# Patient Record
Sex: Female | Born: 1985 | Race: White | Hispanic: No | Marital: Single | Smoking: Former smoker
Health system: Southern US, Community
[De-identification: ages and names within clinical notes are randomized; demographics above are authoritative.]

## PROBLEM LIST (undated history)

## (undated) DIAGNOSIS — I10 Essential (primary) hypertension: Secondary | ICD-10-CM

---
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
## 2015-09-19 DIAGNOSIS — F419 Anxiety disorder, unspecified: Secondary | ICD-10-CM | POA: Insufficient documentation

## 2016-01-18 DIAGNOSIS — N200 Calculus of kidney: Secondary | ICD-10-CM | POA: Insufficient documentation

## 2016-07-11 ENCOUNTER — Ambulatory Visit: Payer: Self-pay | Admitting: Sports Medicine

## 2018-02-18 ENCOUNTER — Ambulatory Visit (INDEPENDENT_AMBULATORY_CARE_PROVIDER_SITE_OTHER): Payer: Self-pay | Admitting: Podiatry

## 2018-02-18 ENCOUNTER — Encounter: Payer: Self-pay | Admitting: Podiatry

## 2018-02-18 VITALS — BP 114/79 | HR 61 | Resp 16 | Ht 63.0 in | Wt 135.0 lb

## 2018-02-18 DIAGNOSIS — L600 Ingrowing nail: Secondary | ICD-10-CM

## 2018-02-18 DIAGNOSIS — F419 Anxiety disorder, unspecified: Secondary | ICD-10-CM

## 2018-02-18 DIAGNOSIS — F411 Generalized anxiety disorder: Secondary | ICD-10-CM

## 2018-02-18 DIAGNOSIS — M79676 Pain in unspecified toe(s): Secondary | ICD-10-CM

## 2018-02-18 MED ORDER — HYDROCODONE-ACETAMINOPHEN 5-325 MG PO TABS: 1.0000 | ORAL_TABLET | Freq: Four times a day (QID) | ORAL | 0 refills | Status: DC | PRN

## 2018-02-18 MED ORDER — NEOMYCIN-POLYMYXIN-HC 3.5-10000-1 OT SOLN: OTIC | 0 refills | Status: DC

## 2018-02-18 NOTE — Progress Notes (Signed)
  Subjective:  Patient ID: Teresa Lam, female    DOB: 17-Jan-1986,  MRN: [id number]  Chief Complaint  Patient presents with  . Nail Problem    R hallux medial border x 2 weeks; 8/10 throbbing pain -no injury/n/V/F/CH -w/ bloody drainage, swelling. and redness Tx: peroxide, alcohol and epsom salt    32 y.o. female presents with the above complaint.  Review of Systems: Negative except as noted in the HPI. Denies N/V/F/Ch.  History reviewed. No pertinent past medical history.  Current Outpatient Medications:  .  labetalol (NORMODYNE) 100 MG tablet, Take 100 mg by mouth 2 (two) times daily., Disp: , Rfl:  .  norethindrone (MICRONOR,CAMILA,ERRIN) 0.35 MG tablet, Take 1 tablet by mouth daily., Disp: , Rfl:   Social History   Tobacco Use  Smoking Status Never Smoker  Smokeless Tobacco Never Used    Not on File Objective:   Vitals:   02/18/18 0917  BP: 114/79  Pulse: 61  Resp: 16   Body mass index is 23.91 kg/m. Constitutional Well developed. Well nourished.  Vascular Dorsalis pedis pulses palpable bilaterally. Posterior tibial pulses palpable bilaterally. Capillary refill normal to all digits.  No cyanosis or clubbing noted. Pedal hair growth normal.  Neurologic Normal speech. Oriented to person, place, and time. Epicritic sensation to light touch grossly present bilaterally.  Dermatologic Painful ingrowing nail at medial nail borders of the hallux nail right. No other open wounds. No skin lesions.  Orthopedic: Normal joint ROM without pain or crepitus bilaterally. No visible deformities. No bony tenderness.   Radiographs: None Assessment:   1. Anxiety due to invasive procedure   2. Ingrown nail   3. Pain around toenail    Plan:  Patient was evaluated and treated and all questions answered.  Ingrown Nail, right -Patient elects to proceed with minor surgery to remove ingrown toenail removal today. Consent reviewed and signed by patient. -Ingrown nail  excised. See procedure note. -Educated on post-procedure care including soaking. Written instructions provided and reviewed. -Patient to follow up in 2 weeks for nail check. -Small Rx for Norco per patient reqeust  Procedure: Excision of Ingrown Toenail Location: Right 1st toe medial nail borders. Anesthesia: Lidocaine 1% plain; 1.5 mL and Marcaine 0.5% plain; 1.5 mL, digital block. Skin Prep: Betadine. Dressing: Silvadene; telfa; dry, sterile, compression dressing. Technique: Following skin prep, the toe was exsanguinated and a tourniquet was secured at the base of the toe. The affected nail border was freed, split with a nail splitter, and excised. Chemical matrixectomy was then performed with phenol and irrigated out with alcohol. The tourniquet was then removed and sterile dressing applied. Disposition: Patient tolerated procedure well. Patient to return in 2 weeks for follow-up.   No follow-ups on file.

## 2018-02-18 NOTE — Patient Instructions (Signed)

## 2018-02-18 NOTE — Progress Notes (Signed)
   Subjective:    Patient ID: Teresa Lam, female    DOB: [birth year], 32 y.o.   MRN: [id number]  HPI    Review of Systems  All other systems reviewed and are negative.      Objective:   Physical Exam        Assessment & Plan:

## 2018-03-04 ENCOUNTER — Ambulatory Visit: Payer: Self-pay | Admitting: Podiatry

## 2018-12-26 ENCOUNTER — Other Ambulatory Visit: Payer: Self-pay

## 2018-12-26 ENCOUNTER — Inpatient Hospital Stay (HOSPITAL_COMMUNITY): Payer: Medicaid Other

## 2018-12-26 ENCOUNTER — Inpatient Hospital Stay (HOSPITAL_COMMUNITY)
Admission: EM | Admit: 2018-12-26 | Discharge: 2018-12-27 | Disposition: A | Discharge status: Home / Self Care | Payer: Medicaid Other | Attending: Obstetrics and Gynecology | Admitting: Obstetrics and Gynecology

## 2018-12-26 ENCOUNTER — Encounter (HOSPITAL_COMMUNITY): Payer: Self-pay | Admitting: *Deleted

## 2018-12-26 DIAGNOSIS — O209 Hemorrhage in early pregnancy, unspecified: Secondary | ICD-10-CM | POA: Insufficient documentation

## 2018-12-26 DIAGNOSIS — O3680X0 Pregnancy with inconclusive fetal viability, not applicable or unspecified: Secondary | ICD-10-CM | POA: Diagnosis not present

## 2018-12-26 DIAGNOSIS — Z87891 Personal history of nicotine dependence: Secondary | ICD-10-CM | POA: Insufficient documentation

## 2018-12-26 DIAGNOSIS — Z3A01 Less than 8 weeks gestation of pregnancy: Secondary | ICD-10-CM | POA: Diagnosis not present

## 2018-12-26 HISTORY — DX: Essential (primary) hypertension: I10

## 2018-12-26 LAB — WET PREP, GENITAL
Clue Cells Wet Prep HPF POC: NONE SEEN
Sperm: NONE SEEN
Trich, Wet Prep: NONE SEEN
Yeast Wet Prep HPF POC: NONE SEEN

## 2018-12-26 LAB — CBC
HCT: 34.1 % — ABNORMAL LOW (ref 36.0–46.0)
Hemoglobin: 11.7 g/dL — ABNORMAL LOW (ref 12.0–15.0)
MCH: 31.7 pg (ref 26.0–34.0)
MCHC: 34.3 g/dL (ref 30.0–36.0)
MCV: 92.4 fL (ref 80.0–100.0)
Platelets: 212 10*3/uL (ref 150–400)
RBC: 3.69 MIL/uL — ABNORMAL LOW (ref 3.87–5.11)
RDW: 12.2 % (ref 11.5–15.5)
WBC: 7.8 10*3/uL (ref 4.0–10.5)
nRBC: 0 % (ref 0.0–0.2)

## 2018-12-26 LAB — ABO/RH: ABO/RH(D): O POS

## 2018-12-26 LAB — POCT PREGNANCY, URINE: Preg Test, Ur: POSITIVE — AB

## 2018-12-26 LAB — HCG, QUANTITATIVE, PREGNANCY: hCG, Beta Chain, Quant, S: 536 m[IU]/mL — ABNORMAL HIGH (ref <5)

## 2018-12-26 NOTE — Discharge Instructions (Signed)
Return to care  °· If you have heavier bleeding that soaks through more that 2 pads per hour for an hour or more °· If you bleed so much that you feel like you might pass out or you do pass out °· If you have significant abdominal pain that is not improved with Tylenol  ° ° ° ° °Vaginal Bleeding During Pregnancy, First Trimester ° °A small amount of bleeding from the vagina (spotting) is relatively common during early pregnancy. It usually stops on its own. Various things may cause bleeding or spotting during early pregnancy. Some bleeding may be related to the pregnancy, and some may not. In many cases, the bleeding is normal and is not a problem. However, bleeding can also be a sign of something serious. Be sure to tell your health care provider about any vaginal bleeding right away. °Some possible causes of vaginal bleeding during the first trimester include: °· Infection or inflammation of the cervix. °· Growths (polyps) on the cervix. °· Miscarriage or threatened miscarriage. °· Pregnancy tissue developing outside of the uterus (ectopic pregnancy). °· A mass of tissue developing in the uterus due to an egg being fertilized incorrectly (molar pregnancy). °Follow these instructions at home: °Activity °· Follow instructions from your health care provider about limiting your activity. Ask what activities are safe for you. °· If needed, make plans for someone to help with your regular activities. °· Do not have sex or orgasms until your health care provider says that this is safe. °General instructions °· Take over-the-counter and prescription medicines only as told by your health care provider. °· Pay attention to any changes in your symptoms. °· Do not use tampons or douche. °· Write down how many pads you use each day, how often you change pads, and how soaked (saturated) they are. °· If you pass any tissue from your vagina, save the tissue so you can show it to your health care provider. °· Keep all follow-up  visits as told by your health care provider. This is important. °Contact a health care provider if: °· You have vaginal bleeding during any part of your pregnancy. °· You have cramps or labor pains. °· You have a fever. °Get help right away if: °· You have severe cramps in your back or abdomen. °· You pass large clots or a large amount of tissue from your vagina. °· Your bleeding increases. °· You feel light-headed or weak, or you faint. °· You have chills. °· You are leaking fluid or have a gush of fluid from your vagina. °Summary °· A small amount of bleeding (spotting) from the vagina is relatively common during early pregnancy. °· Various things may cause bleeding or spotting in early pregnancy. °· Be sure to tell your health care provider about any vaginal bleeding right away. °This information is not intended to replace advice given to you by your health care provider. Make sure you discuss any questions you have with your health care provider. °Document Released: 12/13/2004 Document Revised: 06/24/2018 Document Reviewed: 06/07/2016 °Elsevier Patient Education © 2020 [employer] ° °

## 2018-12-26 NOTE — MAU Provider Note (Signed)
Chief Complaint: Vaginal Bleeding   First Provider Initiated Contact with Patient 12/26/18 2208     SUBJECTIVE HPI: Teresa Lam is a 33 y.o. [id number] at 3835w5d by LMP who presents to [facility] reporting vaginal bleeding & abdominal cramping. Was on OCPs but missed a pill this month. Has had bleeding during this week that she initially thought was a period. Not saturating pads or passing clots but changes a half full pad every 2 hours. Also has abdominal cramping & "Heaviness".   Location: abdomen Quality: cramping, heavy Severity: 6/10 on pain scale Duration: 1 week Timing: intermittent Modifying factors: none Associated signs and symptoms: vaginal bleeding  Past Medical History:  Diagnosis Date  . Hypertension    OB History  Gravida Para Term Preterm AB Living  4 2 2   1 2   SAB TAB Ectopic Multiple Live Births  1       2    # Outcome Date GA Lbr Len/2nd Weight Sex Delivery Anes PTL Lv  4 Current           3 Term 2017     Vag-Spont     2 SAB 2015 6838w0d         1 Term 2013     Vag-Spont      History reviewed. No pertinent surgical history. Social History   Socioeconomic History  . Marital status: Single    Spouse name: Not on file  . Number of children: Not on file  . Years of education: Not on file  . Highest education level: Not on file  Occupational History  . Not on file  Social Needs  . Financial resource strain: Not on file  . Food insecurity    Worry: Not on file    Inability: Not on file  . Transportation needs    Medical: Not on file    Non-medical: Not on file  Tobacco Use  . Smoking status: Former Games developermoker  . Smokeless tobacco: Never Used  Substance and Sexual Activity  . Alcohol use: Yes    Comment: social  . Drug use: Yes    Types: Marijuana  . Sexual activity: Yes  Lifestyle  . Physical activity    Days per week: Not on file    Minutes per session: Not on file  . Stress: Not on file  Relationships  . Social Musicianconnections    Talks  on phone: Not on file    Gets together: Not on file    Attends religious service: Not on file    Active member of club or organization: Not on file    Attends meetings of clubs or organizations: Not on file    Relationship status: Not on file  . Intimate partner violence    Fear of current or ex partner: Not on file    Emotionally abused: Not on file    Physically abused: Not on file    Forced sexual activity: Not on file  Other Topics Concern  . Not on file  Social History Narrative  . Not on file   History reviewed. No pertinent family history. No current facility-administered medications on file prior to encounter.    Current Outpatient Medications on File Prior to Encounter  Medication Sig Dispense Refill  . labetalol (NORMODYNE) 200 MG tablet Take 200 mg by mouth 2 (two) times daily.     No Known Allergies  I have reviewed patient's Past Medical Hx, Surgical Hx, Family Hx, Social Hx, medications and allergies.  Review of Systems  Constitutional: Negative.   Gastrointestinal: Positive for abdominal pain. Negative for constipation, diarrhea, nausea and vomiting.  Genitourinary: Positive for vaginal bleeding. Negative for dysuria and vaginal discharge.    OBJECTIVE Patient Vitals for the past 24 hrs:  BP Temp Temp src Pulse Resp  12/26/18 2201 130/73 98.5 F (36.9 C) Oral 77 20   Constitutional: Well-developed, well-nourished female in no acute distress.  Cardiovascular: normal rate & rhythm, no murmur Respiratory: normal rate and effort. Lung sounds clear throughout GI: Abd soft, non-tender, Pos BS x 4. No guarding or rebound tenderness MS: Extremities nontender, no edema, normal ROM Neurologic: Alert and oriented x 4.  GU:     SPECULUM EXAM: NEFG, small amount of dark red blood coming from os. Cervix pink/smooth  BIMANUAL: No CMT. cervix closed; uterus normal size, no adnexal tenderness or masses.    LAB RESULTS Results for orders placed or performed during the  hospital encounter of 12/26/18 (from the past 24 hour(s))  Pregnancy, urine POC     Status: Abnormal   Collection Time: 12/26/18  9:41 PM  Result Value Ref Range   Preg Test, Ur POSITIVE (A) NEGATIVE  CBC     Status: Abnormal   Collection Time: 12/26/18 10:20 PM  Result Value Ref Range   WBC 7.8 4.0 - 10.5 K/uL   RBC 3.69 (L) 3.87 - 5.11 MIL/uL   Hemoglobin 11.7 (L) 12.0 - 15.0 g/dL   HCT 54.0 (L) 98.1 - 19.1 %   MCV 92.4 80.0 - 100.0 fL   MCH 31.7 26.0 - 34.0 pg   MCHC 34.3 30.0 - 36.0 g/dL   RDW 47.8 29.5 - 62.1 %   Platelets 212 150 - 400 K/uL   nRBC 0.0 0.0 - 0.2 %  ABO/Rh     Status: None   Collection Time: 12/26/18 10:20 PM  Result Value Ref Range   ABO/RH(D) O POS    No rh immune globuloin      NOT A RH IMMUNE GLOBULIN CANDIDATE, PT RH POSITIVE Performed at [facility], [address], [place], [place] [address]   hCG, quantitative, pregnancy     Status: Abnormal   Collection Time: 12/26/18 10:20 PM  Result Value Ref Range   hCG, Beta Chain, Quant, S 536 (H) <5 mIU/mL  Wet prep, genital     Status: Abnormal   Collection Time: 12/26/18 10:25 PM  Result Value Ref Range   Yeast Wet Prep HPF POC NONE SEEN NONE SEEN   Trich, Wet Prep NONE SEEN NONE SEEN   Clue Cells Wet Prep HPF POC NONE SEEN NONE SEEN   WBC, Wet Prep HPF POC FEW (A) NONE SEEN   Sperm NONE SEEN     IMAGING US Ob Less Than 14 Weeks With Ob Transvaginal  Result Date: 12/26/2018 CLINICAL DATA:  Vaginal bleeding. Estimated gestational age of [redacted] weeks, 5 days by LMP. EXAM: OBSTETRIC <14 WK [place] AND TRANSVAGINAL OB US TECHNIQUE: Both transabdominal and transvaginal ultrasound examinations were performed for complete evaluation of the gestation as well as the maternal uterus, adnexal regions, and pelvic cul-de-sac. Transvaginal technique was performed to assess early pregnancy. COMPARISON:  None. FINDINGS: Intrauterine gestational sac: None. Maternal uterus/adnexae: Small amount of fluid within the  endometrial canal. The ovaries are unremarkable. Left corpus luteum. IMPRESSION: 1. No IUP is visualized. By definition, in the setting of a positive pregnancy test, this reflects a pregnancy of unknown location. Differential considerations include early normal IUP, abnormal  IUP/missed abortion, or nonvisualized ectopic pregnancy. Serial beta HCG is suggested. Consider repeat pelvic ultrasound in 14 days. 2. Small amount of fluid within the endometrial canal, nonspecific. Electronically Signed   By: Titus Dubin M.D.   On: 12/26/2018 23:31    MAU COURSE Orders Placed This Encounter  Procedures  . Wet prep, genital  . US OB LESS THAN 14 WEEKS WITH OB TRANSVAGINAL  . CBC  . hCG, quantitative, pregnancy  . Pregnancy, urine POC  . ABO/Rh  . Discharge patient   No orders of the defined types were placed in this encounter.   MDM +UPT UA, wet prep, GC/chlamydia, CBC, ABO/Rh, quant hCG, and [place] today to rule out ectopic pregnancy  RH positive  Ultrasound shows no IUP. HCG 536. This vaginal bleeding & abdominal cramping could represent a normal pregnancy, spontaneous abortion, or even an ectopic pregnancy which can be life-threatening. Cultures were obtained to rule out pelvic infection.   ASSESSMENT 1. Pregnancy of unknown anatomic location   2. Vaginal bleeding in pregnancy, first trimester     PLAN Discharge home in stable condition. Bleeding vs ectopic precautions Scheduled for repeat HCG at [place] on Monday morning GC/CT pending  Follow-up Information    [facility] Follow up.   Specialty: Obstetrics and Gynecology Why: return for worsening symptoms Contact information: [address] [id number] [place] [phone]         Allergies as of 12/26/2018   No Known Allergies     Medication List    TAKE these medications   labetalol 200 MG tablet Commonly known as: NORMODYNE Take 200 mg by mouth 2 (two)  times daily.        Jorje Guild, NP 12/26/2018  11:42 PM

## 2018-12-26 NOTE — MAU Note (Signed)
PT SAYS  SHE DID HPT  TODAY - POSITIVE.  HAD CYCLE - 3-4 WEEKS AGO. LAST SAT HAD VAG BLEEDING- - 6 DAYS OF MED FLOW - AND CRAMPS AND BACK PAIN-  IBUPROFEN .  WAS ON BCP- MISSED 1 PILL.

## 2018-12-29 ENCOUNTER — Other Ambulatory Visit: Payer: Self-pay

## 2018-12-29 ENCOUNTER — Encounter (HOSPITAL_COMMUNITY): Payer: Self-pay

## 2018-12-29 ENCOUNTER — Encounter (HOSPITAL_COMMUNITY): Payer: Self-pay | Admitting: *Deleted

## 2018-12-29 ENCOUNTER — Ambulatory Visit (INDEPENDENT_AMBULATORY_CARE_PROVIDER_SITE_OTHER): Payer: Self-pay

## 2018-12-29 DIAGNOSIS — O3680X0 Pregnancy with inconclusive fetal viability, not applicable or unspecified: Secondary | ICD-10-CM

## 2018-12-29 DIAGNOSIS — O209 Hemorrhage in early pregnancy, unspecified: Secondary | ICD-10-CM

## 2018-12-29 LAB — BETA HCG QUANT (REF LAB): hCG Quant: 464 m[IU]/mL

## 2018-12-29 NOTE — Progress Notes (Addendum)
Pt here today for stat beta hcg lab following visit to MAU on 12/26/18 for abdominal cramping and bleeding. Pt able to explain purpose of visit today. Pt reports some abdominal pain today and changing a quarter full pad every hour for comfort. Educated pt that if she saturates a pad an hour she needs to go to the MAU.   Ectopic precautions discussed with pt. Notified pt we will call with results. Pt verbalizes understanding  Beta hcg level is 464 today. Reviewed pt's hx and results with Jorje Guild, NP, who would like pt to return in 2 days for another stat beta hcg. Called pt to notify her of result and provider's recommendation. Pt agrees to follow up at appt on 10/14 at 10AM. Pt educated to go to the MAU if she experiences any severe pain or bleeding. Pt verbalizes understanding.

## 2018-12-29 NOTE — Progress Notes (Signed)
Chart reviewed for nurse visit. Agree with plan of care.   Jorje Guild, NP 12/29/2018 1:08 PM

## 2018-12-31 ENCOUNTER — Telehealth: Payer: Self-pay | Admitting: Emergency Medicine

## 2018-12-31 ENCOUNTER — Ambulatory Visit: Payer: Medicaid Other

## 2018-12-31 NOTE — Telephone Encounter (Signed)
Pt was contacted regarding her missed appointment to have her beta hcg levels drawn. Pt stated "I just want to see what my body does on it's own". Pt stated she has an obgyn in [place] that she will follow up with and she will have them do a D and C if it is necessary. Pt also verbalized understanding of when to return to MAU for evaluation. Pt had no further questions or concerns.

## 2019-01-01 LAB — GC/CHLAMYDIA PROBE AMP (~~LOC~~) NOT AT ARMC
Chlamydia: NEGATIVE
Comment: NEGATIVE
Comment: NORMAL
Neisseria Gonorrhea: NEGATIVE

## 2019-04-27 ENCOUNTER — Other Ambulatory Visit: Payer: Self-pay

## 2019-04-27 ENCOUNTER — Ambulatory Visit: Payer: Medicaid Other | Admitting: Podiatry

## 2019-04-27 DIAGNOSIS — L600 Ingrowing nail: Secondary | ICD-10-CM | POA: Diagnosis not present

## 2019-04-27 DIAGNOSIS — M79676 Pain in unspecified toe(s): Secondary | ICD-10-CM

## 2019-04-27 MED ORDER — NEOMYCIN-POLYMYXIN-HC 3.5-10000-1 OT SOLN: OTIC | 0 refills | Status: DC

## 2019-04-27 NOTE — Patient Instructions (Signed)

## 2019-04-27 NOTE — Progress Notes (Signed)
  Subjective:  Patient ID: Teresa Lam, female    DOB: [birth year],  MRN: [id number]  Chief Complaint  Patient presents with  . Nail Problem    L thallux medial border x 5 years on and off -pt dneis injury; 5/10 throbbign pain -pt dneies swellgin/redness/draiang eTx; epsom salt and trimming -pt states Rt hallux is starting to do the same     34 y.o. female presents with the above complaint. History confirmed with patient.   Objective:  Physical Exam: warm, good capillary refill, no trophic changes or ulcerative lesions, normal DP and PT pulses and normal sensory exam.  Painful ingrowing nail at  medial border of the left, hallux; without warmth, erythema or drainage  Assessment:   1. Ingrown nail   2. Pain around toenail    Plan:  Patient was evaluated and treated and all questions answered.  Ingrown Nail, left -Patient elects to proceed with ingrown toenail removal today -Ingrown nail excised. See procedure note. -Educated on post-procedure care including soaking. Written instructions provided. -Rx for Cortisporin drops -Patient to follow up in 2 weeks for nail check.  Procedure: Excision of Ingrown Toenail Location: Left 1st toe medial nail borders. Anesthesia: Lidocaine 1% plain; 1.84mL and Marcaine 0.5% plain; 1.56mL, digital block. Skin Prep: Betadine. Dressing: Silvadene; telfa; dry, sterile, compression dressing. Technique: Following skin prep, the toe was exsanguinated and a tourniquet was secured at the base of the toe. The affected nail border was freed, split with a nail splitter, and excised. Chemical matrixectomy was then performed with phenol and irrigated out with alcohol. The tourniquet was then removed and sterile dressing applied. Disposition: Patient tolerated procedure well. Patient to return in 2 weeks for follow-up.  Return in about 2 weeks (around 05/11/2019).

## 2019-05-11 ENCOUNTER — Ambulatory Visit (INDEPENDENT_AMBULATORY_CARE_PROVIDER_SITE_OTHER): Payer: Medicaid Other | Admitting: Podiatry

## 2019-05-11 DIAGNOSIS — Z5329 Procedure and treatment not carried out because of patient's decision for other reasons: Secondary | ICD-10-CM

## 2019-05-11 NOTE — Progress Notes (Signed)
No show for appt. 

## 2020-01-18 ENCOUNTER — Ambulatory Visit: Payer: Medicaid Other

## 2020-01-18 ENCOUNTER — Ambulatory Visit (INDEPENDENT_AMBULATORY_CARE_PROVIDER_SITE_OTHER): Payer: Medicaid Other

## 2020-01-18 ENCOUNTER — Encounter: Payer: Self-pay | Admitting: Podiatry

## 2020-01-18 ENCOUNTER — Ambulatory Visit: Payer: Medicaid Other | Admitting: Podiatry

## 2020-01-18 ENCOUNTER — Other Ambulatory Visit: Payer: Self-pay

## 2020-01-18 ENCOUNTER — Other Ambulatory Visit: Payer: Self-pay | Admitting: Podiatry

## 2020-01-18 DIAGNOSIS — M7751 Other enthesopathy of right foot: Secondary | ICD-10-CM | POA: Diagnosis not present

## 2020-01-18 DIAGNOSIS — M79672 Pain in left foot: Secondary | ICD-10-CM

## 2020-01-18 DIAGNOSIS — M79671 Pain in right foot: Secondary | ICD-10-CM

## 2020-01-18 DIAGNOSIS — M778 Other enthesopathies, not elsewhere classified: Secondary | ICD-10-CM

## 2020-01-18 DIAGNOSIS — M7752 Other enthesopathy of left foot: Secondary | ICD-10-CM

## 2020-01-18 MED ORDER — METHYLPREDNISOLONE 4 MG PO TBPK: ORAL_TABLET | ORAL | 0 refills | Status: DC

## 2020-01-18 NOTE — Patient Instructions (Signed)

## 2020-01-18 NOTE — Progress Notes (Signed)
  Subjective:  Patient ID: Teresa Lam, female    DOB: 10/21/1985,  MRN: [id number]  Chief Complaint  Patient presents with  . Plantar Fasciitis    both of the heels are hurting and the left is the worst    35 y.o. female presents with the above complaint. History confirmed with patient.   Objective:  Physical Exam: warm, good capillary refill, no trophic changes or ulcerative lesions, normal DP and PT pulses and normal sensory exam. Left Foot: POP medial calc tuber, PT and peroneal tendons Right Foot: POP medial calc tuber, mild POP PT tendont  Radiographs: X-ray of both feet: no fracture, dislocation, swelling or degenerative changes noted, no evidence of calcaneal stress fracture and no plantar calcaneal heel spur  Assessment:   1. Heel pain, bilateral   2. Capsulitis of foot, unspecified laterality    Plan:  Patient was evaluated and treated and all questions answered.  Plantar Fasciitis -XR reviewed with patient. -Given multiple areas of inflammation, would benefit from oral steroids rather than injection. -Rx Medrol pack -Educated on stretching and icing. -F/u in 1 month  No follow-ups on file.

## 2020-02-05 DIAGNOSIS — F3281 Premenstrual dysphoric disorder: Secondary | ICD-10-CM | POA: Insufficient documentation

## 2020-02-22 ENCOUNTER — Ambulatory Visit (INDEPENDENT_AMBULATORY_CARE_PROVIDER_SITE_OTHER): Payer: Medicaid Other | Admitting: Podiatry

## 2020-02-22 DIAGNOSIS — Z5329 Procedure and treatment not carried out because of patient's decision for other reasons: Secondary | ICD-10-CM

## 2020-02-22 NOTE — Progress Notes (Signed)
   Complete physical exam  Patient: Teresa Lam   DOB: 01/06/1998   34 y.o. Female  MRN: [id number]  Subjective:    No chief complaint on file.   Teresa Lam is a 34 y.o. female who presents today for a complete physical exam. She reports consuming a {diet types:17450} diet. {types:19826} She generally feels {DESC; WELL/FAIRLY WELL/POORLY:18703}. She reports sleeping {DESC; WELL/FAIRLY WELL/POORLY:18703}. She {does/does not:200015} have additional problems to discuss today.    Most recent fall risk assessment:    09/13/2021   10:42 AM  Fall Risk   Falls in the past year? 0  Number falls in past yr: 0  Injury with Fall? 0  Risk for fall due to : No Fall Risks  Follow up Falls evaluation completed     Most recent depression screenings:    09/13/2021   10:42 AM 08/04/2020   10:46 AM  PHQ 2/9 Scores  PHQ - 2 Score 0 0  PHQ- 9 Score 5     {VISON DENTAL STD PSA (Optional):27386}  {History (Optional):23778}  Patient Care Team: Jessup, Joy, NP as PCP - General (Nurse Practitioner)   Outpatient Medications Prior to Visit  Medication Sig   fluticasone (FLONASE) 50 MCG/ACT nasal spray Place 2 sprays into both nostrils in the morning and at bedtime. After 7 days, reduce to once daily.   norgestimate-ethinyl estradiol (SPRINTEC 28) 0.25-35 MG-MCG tablet Take 1 tablet by mouth daily.   Nystatin POWD Apply liberally to affected area 2 times per day   spironolactone (ALDACTONE) 100 MG tablet Take 1 tablet (100 mg total) by mouth daily.   No facility-administered medications prior to visit.    ROS        Objective:     There were no vitals taken for this visit. {Vitals History (Optional):23777}  Physical Exam   No results found for any visits on 10/19/21. {Show previous labs (optional):23779}    Assessment & Plan:    Routine Health Maintenance and Physical Exam  Immunization History  Administered Date(s) Administered   DTaP 03/22/1999, 05/18/1999,  07/27/1999, 04/11/2000, 10/26/2003   Hepatitis A 08/22/2007, 08/27/2008   Hepatitis B 01/07/1999, 02/14/1999, 07/27/1999   HiB (PRP-OMP) 03/22/1999, 05/18/1999, 07/27/1999, 04/11/2000   IPV 03/22/1999, 05/18/1999, 01/15/2000, 10/26/2003   Influenza,inj,Quad PF,6+ Mos 11/27/2013   Influenza-Unspecified 02/27/2012   MMR 01/14/2001, 10/26/2003   Meningococcal Polysaccharide 08/27/2011   Pneumococcal Conjugate-13 04/11/2000   Pneumococcal-Unspecified 07/27/1999, 10/10/1999   Tdap 08/27/2011   Varicella 01/15/2000, 08/22/2007    Health Maintenance  Topic Date Due   HIV Screening  Never done   Hepatitis C Screening  Never done   INFLUENZA VACCINE  10/17/2021   PAP-Cervical Cytology Screening  10/19/2021 (Originally 01/06/2020)   PAP SMEAR-Modifier  10/19/2021 (Originally 01/06/2020)   TETANUS/TDAP  10/19/2021 (Originally 08/26/2021)   HPV VACCINES  Discontinued   COVID-19 Vaccine  Discontinued    Discussed health benefits of physical activity, and encouraged her to engage in regular exercise appropriate for her age and condition.  Problem List Items Addressed This Visit   None Visit Diagnoses     Annual physical exam    -  Primary   Cervical cancer screening       Need for Tdap vaccination          No follow-ups on file.     Joy Jessup, NP   

## 2020-09-05 ENCOUNTER — Other Ambulatory Visit: Payer: Self-pay | Admitting: Podiatry

## 2020-09-05 ENCOUNTER — Ambulatory Visit (INDEPENDENT_AMBULATORY_CARE_PROVIDER_SITE_OTHER): Payer: Medicaid Other | Admitting: Podiatry

## 2020-09-05 ENCOUNTER — Other Ambulatory Visit: Payer: Self-pay

## 2020-09-05 DIAGNOSIS — M722 Plantar fascial fibromatosis: Secondary | ICD-10-CM

## 2020-09-05 NOTE — Progress Notes (Signed)
  Subjective:  Patient ID: Teresa Lam, female    DOB: 06-18-1985,  MRN: [id number]  Chief Complaint  Patient presents with   Pain    BL plantar foot pian x few mo- 5/10 sorness and bruised feeling -w/ swelling tx: none    35 y.o. female presents with the above complaint. History confirmed with patient.   Objective:  Physical Exam: warm, good capillary refill, no trophic changes or ulcerative lesions, normal DP and PT pulses and normal sensory exam. Left Foot: POP medial calc tuber, PT and peroneal tendons Right Foot: POP medial calc tuber, mild POP PT tendont  Assessment:   1. Plantar fasciitis     Plan:  Patient was evaluated and treated and all questions answered.  Plantar Fasciitis -Improving. -Apply plantar fascial taping for residual pain.  Procedure: Plantar fascial taping. Location: Bilateral plantar fascia Skin Prep: spray adhesive. Technique: Following spray adhesive, a broad piece of moleskine was applied to the plantar foot, followed by interlocking pieces of athletic tape in order to lift and rest the plantar fascia. Disposition: Educated on post-taping care.   No follow-ups on file.

## 2021-01-02 DIAGNOSIS — O10919 Unspecified pre-existing hypertension complicating pregnancy, unspecified trimester: Secondary | ICD-10-CM | POA: Insufficient documentation

## 2021-01-02 IMAGING — US US OB < 14 WEEKS - US OB TV
1 series · 15 of 28 positions shown · non-contrast
Comparison: None.

CLINICAL DATA: Vaginal bleeding. Estimated gestational age of 3
weeks, 5 days by LMP.

EXAM:
OBSTETRIC <14 WK US AND TRANSVAGINAL OB US
TECHNIQUE: Both transabdominal and transvaginal ultrasound examinations were
performed for complete evaluation of the gestation as well as the
maternal uterus, adnexal regions, and pelvic cul-de-sac.
Transvaginal technique was performed to assess early pregnancy.

[Series 1: us ob < 14 weeks - us ob tv · 50 acquisitions, 15 frames shown]
[im 1/50]
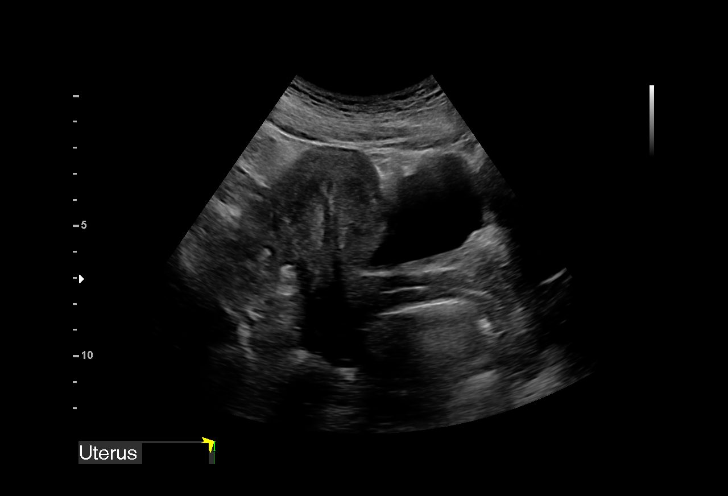
[im 4/50]
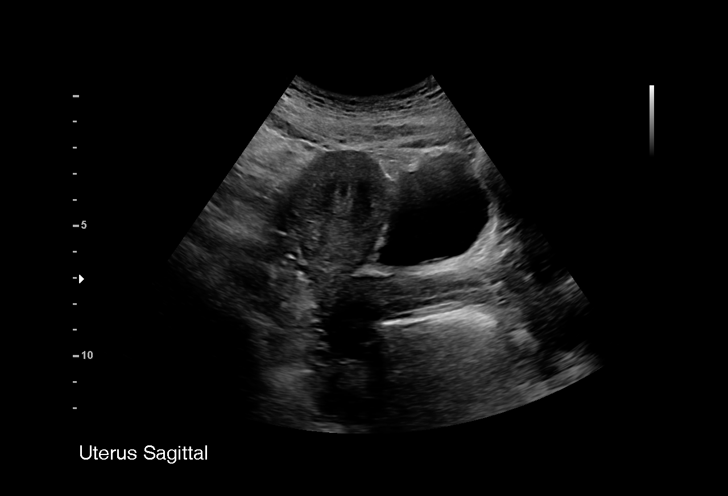
[im 8/50]
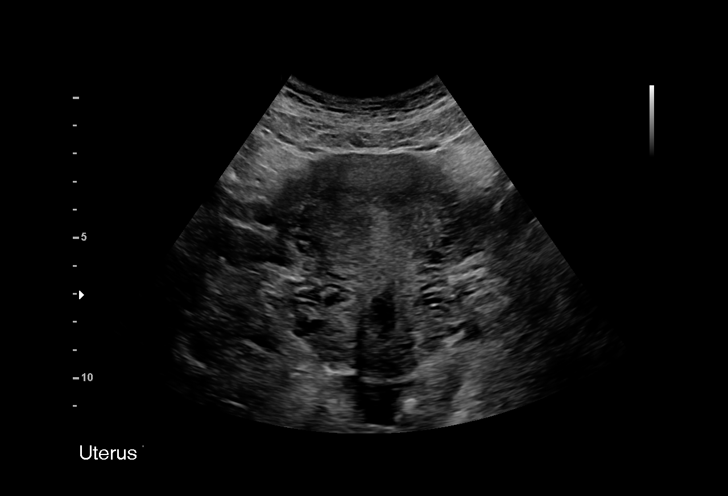
[im 11/50]
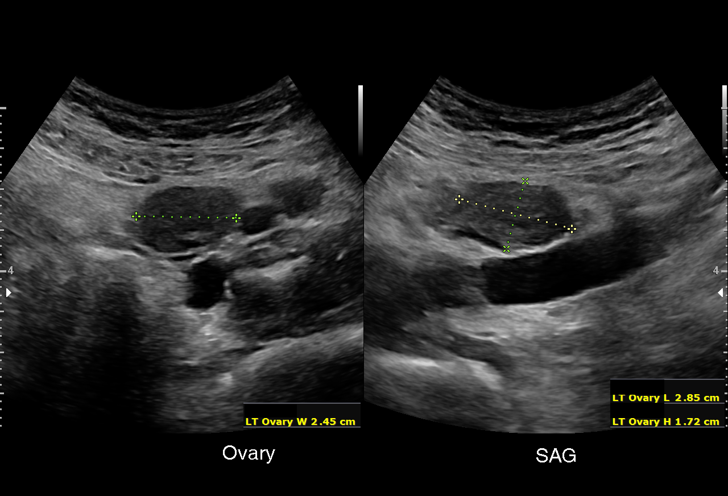
[im 15/50]
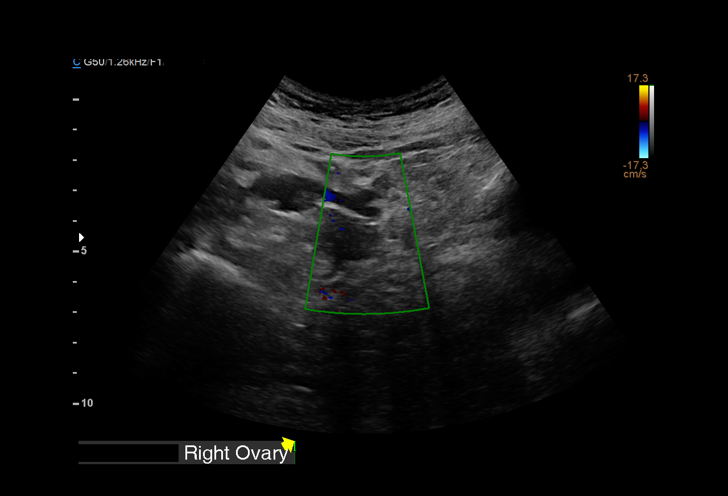
[im 19/50]
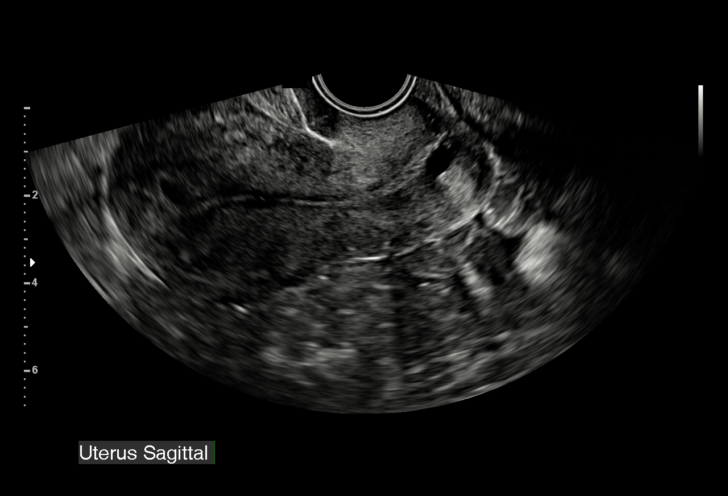
[im 22/50]
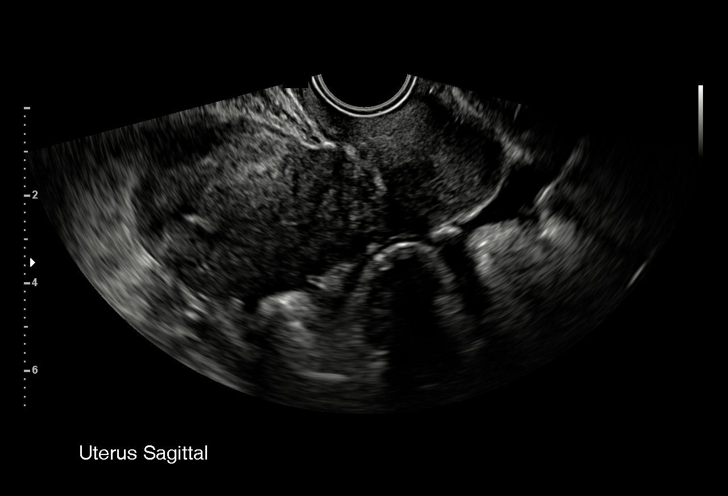
[im 26/50]
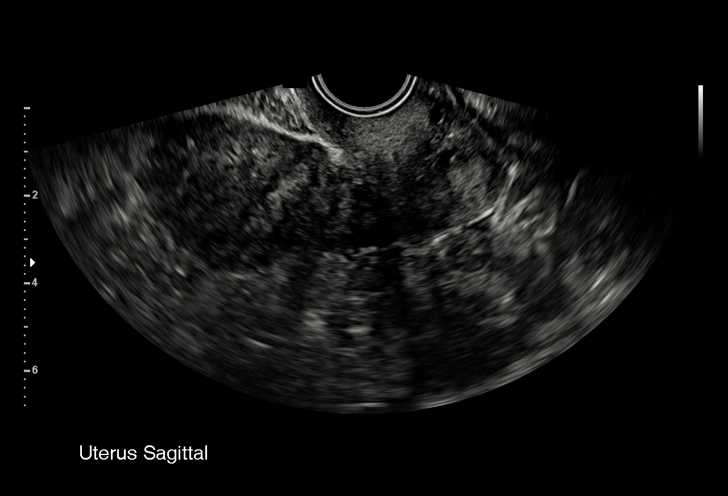
[im 28/50]
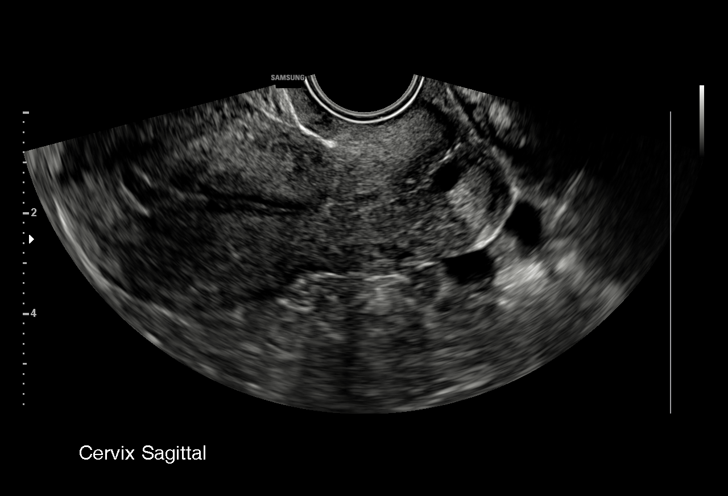
[im 31/50]
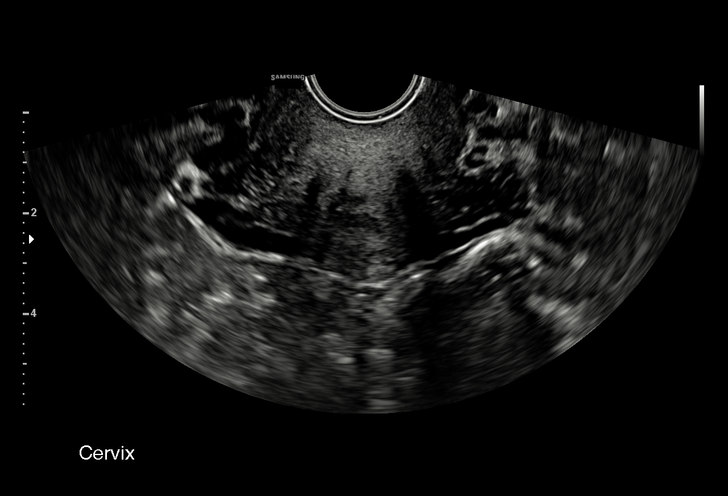
[im 35/50]
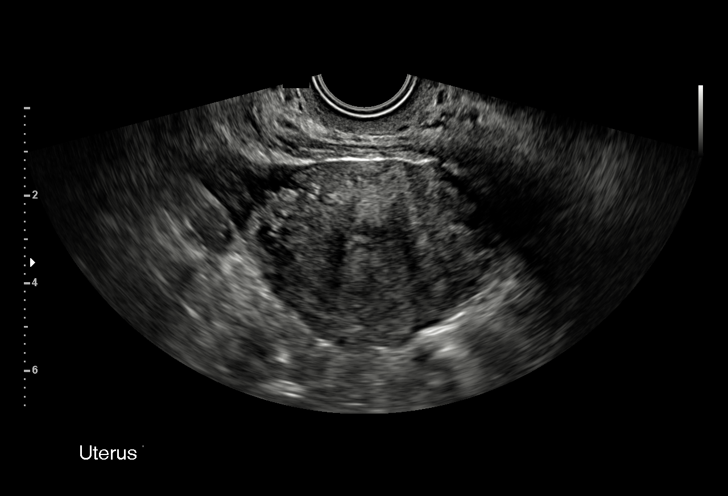
[im 39/50]
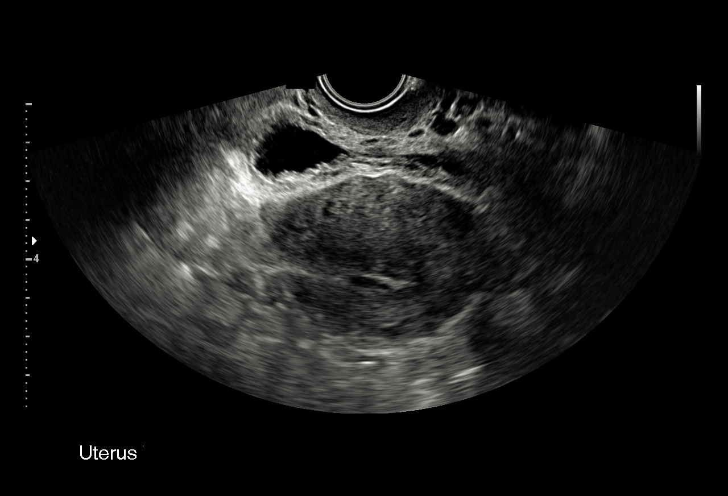
[im 42/50]
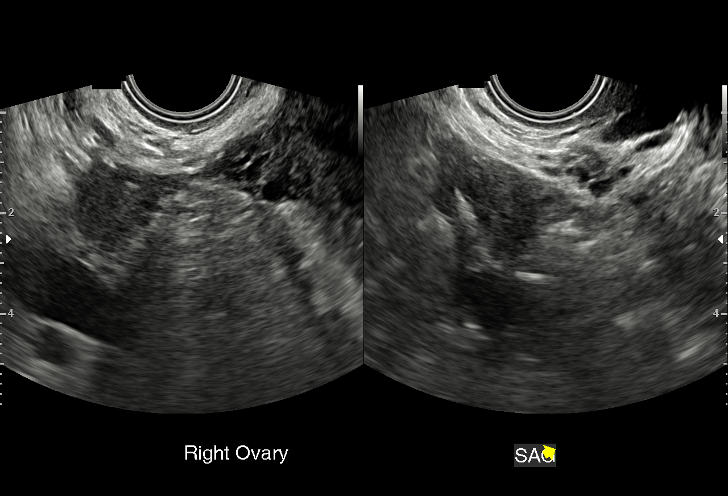
[im 46/50]
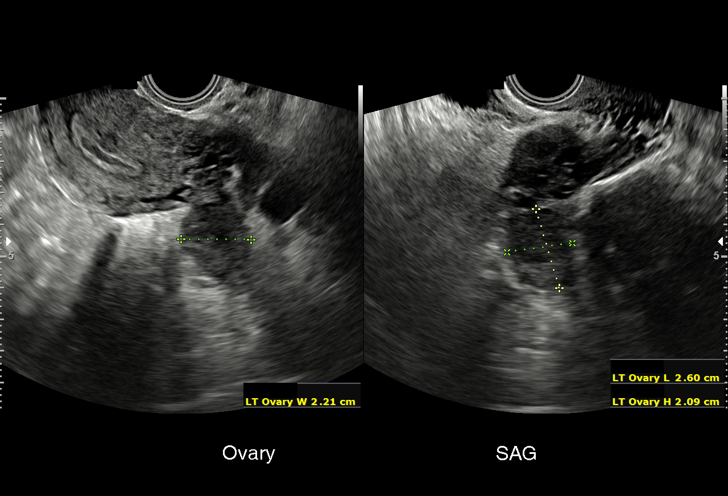
[im 50/50]
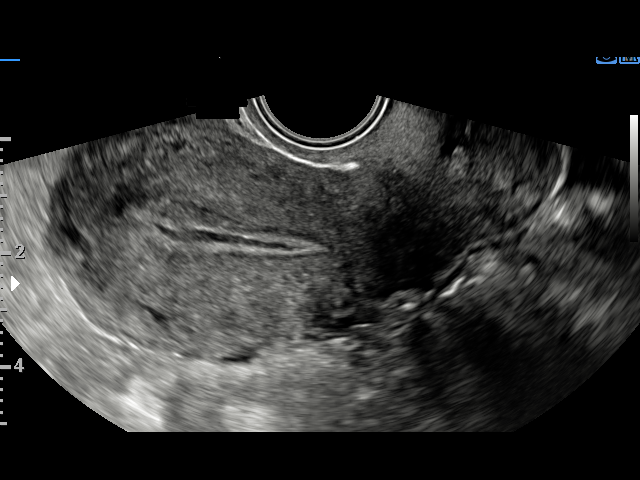

[15 of 28 positions shown; findings below may reference images not displayed]

FINDINGS: Intrauterine gestational sac: None.

Maternal uterus/adnexae: Small amount of fluid within the
endometrial canal. The ovaries are unremarkable. Left corpus luteum.
IMPRESSION: 1. No IUP is visualized. By definition, in the setting of a positive
pregnancy test, this reflects a pregnancy of unknown location.
Differential considerations include early normal IUP, abnormal
IUP/missed abortion, or nonvisualized ectopic pregnancy. Serial beta
HCG is suggested. Consider repeat pelvic ultrasound in 14 days.
2. Small amount of fluid within the endometrial canal, nonspecific.

## 2021-06-06 ENCOUNTER — Ambulatory Visit: Payer: Medicaid Other | Admitting: Sports Medicine

## 2021-06-06 ENCOUNTER — Ambulatory Visit (INDEPENDENT_AMBULATORY_CARE_PROVIDER_SITE_OTHER): Payer: Medicaid Other

## 2021-06-06 ENCOUNTER — Other Ambulatory Visit: Payer: Self-pay

## 2021-06-06 ENCOUNTER — Encounter: Payer: Self-pay | Admitting: Sports Medicine

## 2021-06-06 DIAGNOSIS — M79673 Pain in unspecified foot: Secondary | ICD-10-CM | POA: Diagnosis not present

## 2021-06-06 DIAGNOSIS — M722 Plantar fascial fibromatosis: Secondary | ICD-10-CM

## 2021-06-06 DIAGNOSIS — M216X1 Other acquired deformities of right foot: Secondary | ICD-10-CM

## 2021-06-06 DIAGNOSIS — M7661 Achilles tendinitis, right leg: Secondary | ICD-10-CM

## 2021-06-06 DIAGNOSIS — M216X2 Other acquired deformities of left foot: Secondary | ICD-10-CM | POA: Diagnosis not present

## 2021-06-06 DIAGNOSIS — M7662 Achilles tendinitis, left leg: Secondary | ICD-10-CM

## 2021-06-06 MED ORDER — MELOXICAM 15 MG PO TABS: 15.0000 mg | ORAL_TABLET | Freq: Every day | ORAL | 0 refills | Status: DC

## 2021-06-06 MED ORDER — TRIAMCINOLONE ACETONIDE 10 MG/ML IJ SUSP: 10.0000 mg | Freq: Once | INTRAMUSCULAR | Status: AC

## 2021-06-06 NOTE — Progress Notes (Signed)
Subjective: ?Teresa Lam is a 36 y.o. female patient presents to office with complaint of moderate heel pain on both the left and right foot.  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  Patient denies any other pedal complaints at this time.  Patient is not breast-feeding at this time. ? ?Patient Active Problem List  ? Diagnosis Date Noted  ? Chronic hypertension affecting pregnancy 01/02/2021  ? Premenstrual dysphoric disorder 02/05/2020  ? Nephrolithiasis 01/18/2016  ? Anxiety 09/19/2015  ? ? ?Current Outpatient Medications on File Prior to Visit  ?Medication Sig Dispense Refill  ? metoCLOPramide (REGLAN) 10 MG tablet     ? SUMAtriptan (IMITREX) 25 MG tablet Take by mouth.    ? ALPRAZolam (XANAX) 0.25 MG tablet Take 0.25 mg by mouth 3 (three) times daily as needed.    ? aspirin 81 MG EC tablet Take by mouth.    ? ibuprofen (ADVIL) 800 MG tablet Take by mouth.    ? labetalol (NORMODYNE) 100 MG tablet Take 100 mg by mouth 2 (two) times daily.    ? magnesium gluconate (MAGONATE) 500 MG tablet Take by mouth.    ? metroNIDAZOLE (FLAGYL) 500 MG tablet Take 500 mg by mouth 3 (three) times daily.    ? pantoprazole (PROTONIX) 40 MG tablet Take 1 tablet by mouth daily.    ? sertraline (ZOLOFT) 50 MG tablet Take 50 mg by mouth daily.    ? terconazole (TERAZOL 3) 0.8 % vaginal cream SMARTSIG:1 Application Vaginal Every Night    ? ?No current facility-administered medications on file prior to visit.  ? ? ?No Known Allergies ? ?Objective: ?Physical Exam ?General: The patient is alert and oriented x3 in no acute distress. ? ?Dermatology: Skin is warm, dry and supple bilateral  lower extremities. Nails 1-10 are normal. There is no erythema, edema, no eccymosis, no open lesions present. Integument is otherwise unremarkable. ? ?Vascular: Dorsalis Pedis pulse and Posterior Tibial pulse are 2/4 bilateral. Capillary fill time is immediate to all digits. ? ?Neurological: Grossly intact to light touch bilateral. ? ?Musculoskeletal: Tenderness to palpation at the medial calcaneal tubercale and through the insertion of the plantar fascia on the left and right foot.  No pain with compression of calcaneus bilateral.  No current pain to the Achilles insertion bilateral however subjectively when patient does a lot of walking standing she can feel pulling and pain at the backs of both heels.  No pain with calf compression bilateral. There is decreased Ankle joint range of motion bilateral. All other joints range of motion within normal limits bilateral. Strength 5/5 in all groups bilateral.  ? ? ?Xray, Right/Left foot:  ?Normal osseous mineralization. Joint spaces preserved. No fracture/dislocation/boney destruction. Calcaneal spur present with mild thickening of plantar fascia. No other soft tissue abnormalities or radiopaque foreign bodies.  ? ?Assessment and Plan: ?Problem List Items Addressed This Visit   ?None ?Visit Diagnoses   ? ? Heel pain, unspecified laterality    -  Primary  ? Relevant Orders  ? DG Foot Complete Right  ? DG Foot Complete Left  ?  Plantar fasciitis      ? Achilles tendinitis of both lower extremities      ? Acquired equinus deformity of both feet      ? ?  ? ? ?-Complete examination performed.  ?-Xrays reviewed ?-Discussed with patient in detail the condition of plantar fasciitis occasional Achilles tendinitis with equinus, how this occurs and general treatment options. Explained both conservative and surgical treatments.  ?-After oral consent and aseptic prep, injected a mixture containing 1 ml of 2%  ?plain lidocaine, 1 ml 0.5% plain marcaine, 0.5 ml of kenalog 10 and 0.5 ml  of dexamethasone phosphate into left and right heel. Post-injection care discussed with patient.  ?-Rx Meloxicam and advised use of heel lifts as provided for both this visit ?-Recommend daily stretching exercises ?-Recommend patient to ice affected area 1-2x daily. ?-Patient to return to office in 4 weeks for follow up if no better or sooner if problems or questions arise. ? ?Landis Martins, DPM ? ?

## 2021-06-06 NOTE — Addendum Note (Signed)
Addended by: Cranford Mon R on: 06/06/2021 01:55 PM ? ? Modules accepted: Orders ? ?

## 2021-06-20 ENCOUNTER — Telehealth: Payer: Self-pay | Admitting: Sports Medicine

## 2021-06-20 NOTE — Telephone Encounter (Signed)
Patient got injections in both feet on 06/06/21 - she is now having a problem  swelling and tightness in her feet, along with pain . The tendon is extremely tight feeling.  Please advise

## 2021-06-21 NOTE — Telephone Encounter (Signed)
Pt called back because she has not heard back from yesterday after leaving a message and is having a different type of pain in her feet after the injections. ? ?Please advise ?

## 2021-06-22 NOTE — Telephone Encounter (Signed)
Sent information thru [employer]. ? ?

## 2021-06-22 NOTE — Telephone Encounter (Signed)
Called pt to give advise and her voicemail is full. ?

## 2021-07-19 ENCOUNTER — Encounter: Payer: Self-pay | Admitting: Sports Medicine

## 2021-07-19 ENCOUNTER — Ambulatory Visit (INDEPENDENT_AMBULATORY_CARE_PROVIDER_SITE_OTHER): Payer: Medicaid Other | Admitting: Sports Medicine

## 2021-07-19 DIAGNOSIS — M79673 Pain in unspecified foot: Secondary | ICD-10-CM

## 2021-07-19 DIAGNOSIS — M79675 Pain in left toe(s): Secondary | ICD-10-CM

## 2021-07-19 DIAGNOSIS — M7662 Achilles tendinitis, left leg: Secondary | ICD-10-CM

## 2021-07-19 DIAGNOSIS — L600 Ingrowing nail: Secondary | ICD-10-CM

## 2021-07-19 DIAGNOSIS — M722 Plantar fascial fibromatosis: Secondary | ICD-10-CM

## 2021-07-19 DIAGNOSIS — M216X2 Other acquired deformities of left foot: Secondary | ICD-10-CM

## 2021-07-19 DIAGNOSIS — M216X1 Other acquired deformities of right foot: Secondary | ICD-10-CM

## 2021-07-19 DIAGNOSIS — M7661 Achilles tendinitis, right leg: Secondary | ICD-10-CM

## 2021-07-19 NOTE — Progress Notes (Signed)
Subjective: ?Teresa Lam is a 36 y.o. female patient presents to office today complaining of a moderately painful incurvated and swollen lateral nail border of the 1st toe on the left foot. This has been present for a long time had other borders done before. Patient also states that her heel pain got worse before it got better after the last shots. States that she had some tingling R>L up to the knee that got better after a few weeks. No other issues at this time. ? ?Patient Active Problem List  ? Diagnosis Date Noted  ? Chronic hypertension affecting pregnancy 01/02/2021  ? Premenstrual dysphoric disorder 02/05/2020  ? Nephrolithiasis 01/18/2016  ? Anxiety 09/19/2015  ? ? ?Current Outpatient Medications on File Prior to Visit  ?Medication Sig Dispense Refill  ? ALPRAZolam (XANAX) 0.25 MG tablet Take 0.25 mg by mouth 3 (three) times daily as needed.    ? aspirin 81 MG EC tablet Take by mouth.    ? labetalol (NORMODYNE) 100 MG tablet Take 100 mg by mouth 2 (two) times daily.    ? meloxicam (MOBIC) 15 MG tablet Take 1 tablet (15 mg total) by mouth daily. 30 tablet 0  ? pantoprazole (PROTONIX) 40 MG tablet Take 1 tablet by mouth daily.    ? sertraline (ZOLOFT) 50 MG tablet Take 50 mg by mouth daily.    ? ?Current Facility-Administered Medications on File Prior to Visit  ?Medication Dose Route Frequency Provider Last Rate Last Admin  ? triamcinolone acetonide (KENALOG) 10 MG/ML injection 10 mg  10 mg Other Once Landis Martins, DPM      ? ? ?No Known Allergies ? ?Objective:  ?There were no vitals filed for this visit. ? ?General: Well developed, nourished, in no acute distress, alert and oriented x3  ? ?Dermatology: Skin is warm, dry and supple bilateral. Left hallux nail appears to be moderately incurvated with hyperkeratosis formation at the distal aspects of  ?The lateral nail border. (+) Faint erythema. (+) Edema. (-) serosanguous  ?drainage present. The remaining nails appear unremarkable at this time.  There are no open sores, lesions or other signs of infection  ?present. ? ?Vascular: Dorsalis Pedis artery and Posterior Tibial artery pedal pulses are 2/4 bilateral with immedate capillary fill time. Pedal hair growth present. No lower extremity edema.  ? ?Neruologic: Grossly intact via light touch bilateral. ? ?Musculoskeletal: Decreased tenderness to plantar fascia insertion on both heels. Limited ankle joint Range of Motion. There is tenderness to palpation of the Left hallux lateral nail fold. Muscular strength within normal limits in all groups bilateral.  ? ?Assesement and Plan: ?Problem List Items Addressed This Visit   ?None ?Visit Diagnoses   ? ? Ingrown nail of great toe    -  Primary  ? Toe pain, left      ? Heel pain, unspecified laterality      ? Plantar fasciitis      ? Achilles tendinitis of both lower extremities      ? Acquired equinus deformity of both feet      ? ?  ? ? ?-Discussed treatment alternatives and plan of care; Explained permanent/temporary nail avulsion and post procedure course to patient. Patient elects for PNA Left hallux lateral border ?- After a verbal and written consent, injected 3 ml of a 50:50 mixture of 2% plain lidocaine and 0.5% plain marcaine in a normal hallux block fashion. Next, a  ?betadine prep was performed. Anesthesia was tested and found to be appropriate.  ?The  offending left hallux lateral nail border was then incised from the hyponychium to the epinychium. The offending nail border was removed and cleared from the field. The area was curretted for any remaining nail or spicules. Phenol application performed and the area was then flushed with alcohol and dressed with antibiotic cream and a dry sterile dressing. ?-Patient was instructed to leave the dressing intact for today and begin soaking  ?in a weak solution of betadine or Epsom salt and water tomorrow. Patient was instructed to  ?soak for 15-20 minutes each day and apply neosporin/corticosporin and a gauze  or bandaid dressing each day. ?-Patient was instructed to monitor the toe for signs of infection and return to office if toe becomes red, hot or swollen. ?-Advised ice, elevation, and tylenol or motrin if needed for pain.  ?-Discussed continued care for Planter fasciitis and equinus ?-Advised patient to continue with gentle stretching icing and good supportive shoes with over-the-counter insoles may benefit from getting custom insoles from the good feet store ?-Patient is to return in 2 weeks for follow up care/nail check or sooner if problems arise. ? ?Landis Martins, DPM ?

## 2021-07-19 NOTE — Patient Instructions (Signed)
Shoe List ?For tennis shoes recommend:  ?Kandy Garrison ?Asics ?New balance ?Saucony ?HOKA ?Can be purchased at [employer] sports or [employer] ?Sketchers arch fit ?Can be purchased at any major retailers ? ?Vionic  ?SAS ?Can be purchased at [place] or [place]  ? ?For work shoes recommend: ?Runner, broadcasting/film/video Work ?Timberland boots  ?Redwing boots ?Can be purchased at a variety of places or [employer]  ? ?For casual shoes recommend: ?Oofos ?Can be purchased at [employer] sports or [employer]feet ?Vionic  ?Can be purchased at [place] or [place]  ? ?Orthotics  ?For Over-the-Counter Orthotics recommend: ?Power Steps ?Can be purchased in office/[facility] ?Superfeet ?Can be purchased at [employer] sports or Fleetfeet ?Airplus ?Can be purchased at [facility] ?For Custom Orthotics recommend: ?Custom fitting/Appointment at [place] and [place] ? ? ? ?Ingrown Soak Instructions ? ? ? ?THE DAY AFTER THE PROCEDURE ? ?Place 1/4 cup of epsom salts in a quart of warm tap water.  Submerge your foot or feet with outer bandage intact for the initial soak; this will allow the bandage to become moist and wet for easy lift off.  Once you remove your bandage, continue to soak in the solution for 20 minutes.  This soak should be done twice a day.  Next, remove your foot or feet from solution, blot dry the affected area and cover.  You may use a band aid large enough to cover the area or use gauze and tape.  Apply other medications to the area as directed by the doctor such as polysporin neosporin. ? ?IF YOUR SKIN BECOMES IRRITATED WHILE USING THESE INSTRUCTIONS, IT IS OKAY TO SWITCH TO  WHITE VINEGAR AND WATER. Or you may use antibacterial soap and water to keep the toe clean ? ?Monitor for any signs/symptoms of infection. Call the office immediately if any occur or go directly to the emergency room. Call with any questions/concerns. ? ? ? ?[place] Instructions-Post Nail Surgery ? ?You have had your ingrown toenail and root treated with a  chemical.  This chemical causes a burn that will drain and ooze like a blister.  This can drain for 6-8 weeks or longer.  It is important to keep this area clean, covered, and follow the soaking instructions dispensed at the time of your surgery.  This area will eventually dry and form a scab.  Once the scab forms you no longer need to soak or apply a dressing.  If at any time you experience an increase in pain, redness, swelling, or drainage, you should contact the office as soon as possible. ? ?

## 2021-08-02 ENCOUNTER — Other Ambulatory Visit: Payer: Self-pay | Admitting: Sports Medicine

## 2021-08-03 NOTE — Telephone Encounter (Signed)
Interaction between medication and sertraline

## 2021-08-15 ENCOUNTER — Ambulatory Visit: Payer: Medicaid Other | Admitting: Sports Medicine

## 2021-08-15 ENCOUNTER — Encounter: Payer: Self-pay | Admitting: Sports Medicine

## 2021-08-15 DIAGNOSIS — M216X2 Other acquired deformities of left foot: Secondary | ICD-10-CM

## 2021-08-15 DIAGNOSIS — Z9889 Other specified postprocedural states: Secondary | ICD-10-CM

## 2021-08-15 DIAGNOSIS — M79675 Pain in left toe(s): Secondary | ICD-10-CM

## 2021-08-15 DIAGNOSIS — M7661 Achilles tendinitis, right leg: Secondary | ICD-10-CM

## 2021-08-15 DIAGNOSIS — M79673 Pain in unspecified foot: Secondary | ICD-10-CM

## 2021-08-15 DIAGNOSIS — M7662 Achilles tendinitis, left leg: Secondary | ICD-10-CM

## 2021-08-15 DIAGNOSIS — M722 Plantar fascial fibromatosis: Secondary | ICD-10-CM

## 2021-08-15 DIAGNOSIS — M216X1 Other acquired deformities of right foot: Secondary | ICD-10-CM

## 2021-08-15 MED ORDER — MELOXICAM 15 MG PO TABS: ORAL_TABLET | ORAL | 0 refills | Status: DC

## 2021-08-15 NOTE — Progress Notes (Signed)
Subjective: Teresa Lam is a 36 y.o. female patient returns to office today for follow up evaluation after having left hallux lateral permanent nail avulsion performed on (07/19/2021 ). Patient has been soaking using epsom salt and applying topical antibiotic covered with bandaid daily and states that it is healed well she has returned to painting her toenails and there is not anything hurting or draining. Patient deniesfever/chills/nausea/vomitting/any other related constitutional symptoms at this time but does state that she still gets some soreness in the bottoms of both feet the meloxicam her previous prescribed for her seem to help and is wondering if there is anything else she needs to do.  Patient Active Problem List   Diagnosis Date Noted   Chronic hypertension affecting pregnancy 01/02/2021   Premenstrual dysphoric disorder 02/05/2020   Nephrolithiasis 01/18/2016   Anxiety 09/19/2015    Current Outpatient Medications on File Prior to Visit  Medication Sig Dispense Refill   ALPRAZolam (XANAX) 0.25 MG tablet Take 0.25 mg by mouth 3 (three) times daily as needed.     aspirin 81 MG EC tablet Take by mouth.     labetalol (NORMODYNE) 100 MG tablet Take 100 mg by mouth 2 (two) times daily.     magnesium gluconate (MAGONATE) 500 MG tablet Take by mouth.     pantoprazole (PROTONIX) 40 MG tablet Take 1 tablet by mouth daily.     sertraline (ZOLOFT) 50 MG tablet Take 50 mg by mouth daily.     Current Facility-Administered Medications on File Prior to Visit  Medication Dose Route Frequency Provider Last Rate Last Admin   triamcinolone acetonide (KENALOG) 10 MG/ML injection 10 mg  10 mg Other Once Asencion Islam, DPM        No Known Allergies  Objective:  General: Well developed, nourished, in no acute distress, alert and oriented x3   Dermatology: Skin is warm, dry and supple bilateral.  Left hallux lateral nail bed appears to be clean, dry, well-healed with no erythema, no edema,  no active drainage present. The remaining nails appear unremarkable at this time. There are no other lesions or other signs of infection  present.  Neurovascular status: Intact. No lower extremity swelling; No pain with calf compression bilateral.  Musculoskeletal: Decreased tenderness to palpation of the left hallux lateral nail fold.  There is minimal tenderness to palpation plantar surface of both feet at plantar fascial insertions there is limited ankle joint range of motion consistent with equinus.  Assesement and Plan: Problem List Items Addressed This Visit   None Visit Diagnoses     S/P nail surgery    -  Primary   Toe pain, left       Heel pain, unspecified laterality       Plantar fasciitis       Achilles tendinitis of both lower extremities       Acquired equinus deformity of both feet           -Examined patient  -Left hallux lateral margin well-healed patient may discontinue soaking May discontinue antibiotic cream and may discontinue Band-Aid. -Discussed with patient long-term care for Planter fasciitis and equinus -Advised patient to be consistent with daily stretching -Advised patient to wear good supportive shoes and recommended over-the-counter super feet orthotics -Advised patient to continue with anti-inflammatories since they have been helpful refill meloxicam at this time -Advised patient in the future if symptoms fail to continue to improve may benefit from shockwave treatment in the [place] office however at this time patient  will try being more consistent with stretching and try new shoes as I have advised -Patient is to return as needed or sooner if problems arise.  Asencion Islam, DPM

## 2021-08-15 NOTE — Patient Instructions (Signed)
Shoe List ?For tennis shoes recommend:  ?Brooks Beast ?Asics ?New balance ?Saucony ?HOKA ?Can be purchased at [employer] or [employer] ?Sketchers arch fit ?Can be purchased at any major retailers ? ?Vionic  ?SAS ?Can be purchased at [place] or [employer]  ? ?For work shoes recommend: ?Sketchers Work ?Timberland boots  ?Redwing boots ?Can be purchased at a variety of places or [employer]  ? ?For casual shoes recommend: ?Oofos ?Can be purchased at [employer] or [employer] ?Vionic  ?Can be purchased at [place] or [employer]  ? ?Orthotics  ?For Over-the-Counter Orthotics recommend: ?Power Steps ?Can be purchased in office/[facility] ?Superfeet ?Can be purchased at [employer] or [employer] ?Airplus ?Can be purchased at [employer] ?For Custom Orthotics recommend: ?Custom fitting/Appointment at [facility] ? ?

## 2022-02-02 ENCOUNTER — Ambulatory Visit (INDEPENDENT_AMBULATORY_CARE_PROVIDER_SITE_OTHER): Payer: Medicaid Other | Admitting: Podiatry

## 2022-02-02 DIAGNOSIS — M216X2 Other acquired deformities of left foot: Secondary | ICD-10-CM | POA: Diagnosis not present

## 2022-02-02 DIAGNOSIS — M722 Plantar fascial fibromatosis: Secondary | ICD-10-CM

## 2022-02-02 DIAGNOSIS — M216X1 Other acquired deformities of right foot: Secondary | ICD-10-CM | POA: Diagnosis not present

## 2022-02-02 MED ORDER — MELOXICAM 15 MG PO TABS: 15.0000 mg | ORAL_TABLET | Freq: Every day | ORAL | 0 refills | Status: DC

## 2022-02-02 NOTE — Progress Notes (Signed)
  Subjective:  Patient ID: Teresa Lam, female    DOB: [birth year],  MRN: [id number]  Chief Complaint  Patient presents with   Foot Pain    RIGHT PLANTAR FASCIITIS    36 y.o. female presents with the above complaint.  Presenting with concern for right heel pain.  Pain is primarily in the bottom of the heel.  She states the pain is worse with the first up out of bed in the morning.  Feels like a stabbing pain in the bottom of the heel.  Has tried icing and stretching.  Has tried meloxicam in the past and has helped.   Review of Systems: Negative except as noted in the HPI. Denies N/V/F/Ch.   Objective:  There were no vitals filed for this visit. There is no height or weight on file to calculate BMI. Constitutional Well developed. Well nourished.  Vascular Dorsalis pedis pulses palpable bilaterally. Posterior tibial pulses palpable bilaterally. Capillary refill normal to all digits.  No cyanosis or clubbing noted. Pedal hair growth normal.  Neurologic Normal speech. Oriented to person, place, and time. Epicritic sensation to light touch grossly present bilaterally.  Dermatologic Nails well groomed and normal in appearance. No open wounds. No skin lesions.  Orthopedic: Normal joint ROM without pain or crepitus bilaterally. No visible deformities. Tender to palpation at the calcaneal tuber right. No pain with calcaneal squeeze right. Ankle ROM diminished range of motion right. Silfverskiold Test: negative right.   Radiographs: Taken and reviewed. No acute fractures or dislocations. No evidence of stress fracture.  Plantar heel spur absent. Posterior heel spur absent.   Assessment:   1. Plantar fasciitis, right   2. Acquired equinus deformity of both feet    Plan:  Patient was evaluated and treated and all questions answered.  Plantar Fasciitis, right - XR reviewed as above.  - Educated on icing and stretching. Instructions given.  - Injection delivered to the  plantar fascia as below. - DME: Night splint dispensed.  Power step orthotics dispensed - Pharmacologic management: Meloxicam 15 mg take once daily for pain. Educated on risks/benefits and proper taking of medication.  Procedure: Injection Tendon/Ligament Location: Right plantar fascia at the glabrous junction; medial approach. Skin Prep: alcohol Injectate: 1 cc 0.5% marcaine plain, 1 cc kenalog 10. Disposition: Patient tolerated procedure well. Injection site dressed with a band-aid.  Return in about 4 weeks (around 03/02/2022) for Follow-up right plantar fasciitis.

## 2022-03-02 ENCOUNTER — Ambulatory Visit (INDEPENDENT_AMBULATORY_CARE_PROVIDER_SITE_OTHER): Payer: Medicaid Other | Admitting: Podiatry

## 2022-03-02 DIAGNOSIS — Z91199 Patient's noncompliance with other medical treatment and regimen due to unspecified reason: Secondary | ICD-10-CM

## 2022-03-02 NOTE — Progress Notes (Signed)
Pt was a no show for apt, no charge 

## 2022-10-16 ENCOUNTER — Ambulatory Visit (INDEPENDENT_AMBULATORY_CARE_PROVIDER_SITE_OTHER): Payer: Medicaid Other | Admitting: Allergy

## 2022-10-16 ENCOUNTER — Encounter: Payer: Self-pay | Admitting: Allergy

## 2022-10-16 VITALS — BP 94/66 | HR 59 | Temp 98.0°F | Resp 12 | Ht 64.0 in | Wt 172.0 lb

## 2022-10-16 DIAGNOSIS — T7800XD Anaphylactic reaction due to unspecified food, subsequent encounter: Secondary | ICD-10-CM

## 2022-10-16 DIAGNOSIS — R140 Abdominal distension (gaseous): Secondary | ICD-10-CM

## 2022-10-16 DIAGNOSIS — R1013 Epigastric pain: Secondary | ICD-10-CM

## 2022-10-16 DIAGNOSIS — T7800XA Anaphylactic reaction due to unspecified food, initial encounter: Secondary | ICD-10-CM | POA: Diagnosis not present

## 2022-10-16 MED ORDER — EPINEPHRINE 0.3 MG/0.3ML IJ SOAJ: 0.3000 mg | INTRAMUSCULAR | 1 refills | Status: AC | PRN

## 2022-10-16 NOTE — Progress Notes (Signed)
New Patient Note  RE: Teresa Lam MRN: [id number] DOB: Aug 16, 1985 Date of Office Visit: 10/16/2022  Primary care provider: Nonnie Done., MD  Chief Complaint: food intolerance  History of present illness: Teresa Lam is a 37 y.o. female presenting today for evaluation of multiple food allergies.    She states since giving birth she has had issues with soy and milk products.  She reports ' inflamed upper intestines' and states its like bloating but 'angry'.  Also has diarrhea, nausea, increased reflux.  Symptoms can be 'almost immediately'.  She is just very uncomfortable and states she can feel bad for the next week.  She states even cross-contamination causes problems like if butter is used on a grill.  She also can't tolerate baked milk products either.  She states even if label says 'trace' amounts of milk she still develops symptoms.  She has tried lactose-free milk products and still developed same symptoms with regular dairy products. Mother states her son seems to have similar symptoms as she has had and mother did remove dairy from diet after birth (son has been on hypoallergenic formula).  He is now 18 months.   She denies any cutaneous, respiratory or CV related symptoms with food ingestion.   She has seen GI for these symptoms.  EGD report from 09/08/21 showed superficial gastritis without bleeding.  Pathology negative for H.pylori.  stool studies negative including for C. Diff.  It was felt that her symptoms were likely related to allergy.   She no history of asthma, eczema or allergic rhinoconjunctivitis.   Review of systems: 10pt ROS is negative unless noted above in HPI  Past medical history: Past Medical History:  Diagnosis Date   Hypertension     Past surgical history: History reviewed. No pertinent surgical history.  Family history:  History reviewed. No pertinent family history.  Social history: Lives in a home without carpeting with  electric heating and central cooling.  Dog in the home.  No concern for water damage, mildew or roaches in the home.  She is a Administrator.  Denies smoking history.    Medication List: Current Outpatient Medications  Medication Sig Dispense Refill   ALPRAZolam (XANAX) 0.25 MG tablet Take 0.25 mg by mouth 3 (three) times daily as needed.     atenolol (TENORMIN) 100 MG tablet Take by mouth.     EPINEPHrine (EPIPEN 2-PAK) 0.3 mg/0.3 mL IJ SOAJ injection Inject 0.3 mg into the muscle as needed for anaphylaxis. 2 each 1   pantoprazole (PROTONIX) 40 MG tablet Take 1 tablet by mouth daily.     sertraline (ZOLOFT) 100 MG tablet Take 100 mg by mouth daily.     Current Facility-Administered Medications  Medication Dose Route Frequency Provider Last Rate Last Admin   triamcinolone acetonide (KENALOG) 10 MG/ML injection 10 mg  10 mg Other Once Asencion Islam, DPM        Known medication allergies: No Known Allergies   Physical examination: Blood pressure 94/66, pulse (!) 59, temperature 98 F (36.7 C), temperature source Temporal, resp. rate 12, height 5\' 4"  (1.626 m), weight 172 lb (78 kg), SpO2 97%, unknown if currently breastfeeding.  General: Alert, interactive, in no acute distress. HEENT: PERRLA, TMs pearly gray, turbinates non-edematous without discharge, post-pharynx non erythematous. Neck: Supple without lymphadenopathy. Lungs: Clear to auscultation without wheezing, rhonchi or rales. {no increased work of breathing. CV: Normal S1, S2 without murmurs. Abdomen: Nondistended, nontender. Skin: Warm and dry, without lesions or rashes.  Extremities:  No clubbing, cyanosis or edema. Neuro:   Grossly intact.  Diagnositics/Labs:  Allergy testing:   Food Adult Perc - 10/16/22 0900     Time Antigen Placed 0945    Allergen Manufacturer Waynette Buttery    Location Back    Number of allergen test 5     Control-buffer 50% Glycerol Negative    Control-Histamine 2+    2. Soybean 2+   5x5   5. Milk,  Cow 2+   7x7   6. Casein 3+   10x10            Allergy testing results were read and interpreted by provider, documented by clinical staff.   Assessment and plan: Food allergy/Allergic reaction - We have discussed the following in regards to foods:   Allergy: food allergy is when you have eaten a food, developed an allergic reaction after eating the food and have IgE to the food (positive food testing either by skin testing or blood testing).  Food allergy could lead to life threatening symptoms  Sensitivity: occurs when you have IgE to a food (positive food testing either by skin testing or blood testing) but is a food you eat without any issues.  This is not an allergy and we recommend keeping the food in the diet  Intolerance: this is when you have negative testing by either skin testing or blood testing thus not allergic but the food causes symptoms (like belly pain, bloating, diarrhea etc) with ingestion.  These foods should be avoided to prevent symptoms.     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   I appreciate the opportunity to take part in Teresa Lam's care. Please do not hesitate to contact me with questions.  Sincerely,   Margo Aye, MD Allergy/Immunology [facility]

## 2022-10-16 NOTE — Patient Instructions (Addendum)
-   We have discussed the following in regards to foods:   Allergy: food allergy is when you have eaten a food, developed an allergic reaction after eating the food and have IgE to the food (positive food testing either by skin testing or blood testing).  Food allergy could lead to life threatening symptoms  Sensitivity: occurs when you have IgE to a food (positive food testing either by skin testing or blood testing) but is a food you eat without any issues.  This is not an allergy and we recommend keeping the food in the diet  Intolerance: this is when you have negative testing by either skin testing or blood testing thus not allergic but the food causes symptoms (like belly pain, bloating, diarrhea etc) with ingestion.  These foods should be avoided to prevent symptoms.     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

## 2023-06-11 ENCOUNTER — Encounter: Payer: Self-pay | Admitting: Podiatry

## 2023-06-11 ENCOUNTER — Ambulatory Visit (INDEPENDENT_AMBULATORY_CARE_PROVIDER_SITE_OTHER): Admitting: Podiatry

## 2023-06-11 DIAGNOSIS — L600 Ingrowing nail: Secondary | ICD-10-CM

## 2023-06-11 NOTE — Progress Notes (Unsigned)
 Subjective:  Patient ID: Teresa Lam, female    DOB: 10-07-1985,  MRN: [id number]  Teresa Lam presents to clinic today for:  Chief Complaint  Patient presents with   Ingrown Toenail    Bilateral great toes, bilateral borders. The right great toe, medial border and the left lateral border are both swollen with no drainage. She had the right border remove in 2023.  Not diabetic and no anti coag. She also stated that the plantar facitis is acting up. She is losing insurance at the end of the month.   Patient presenting with painful ingrown toenails to bilateral great toes, bilateral borders.  She has had procedure done as stated above with some recurrence.  The affected borders become painful for the patient with shoe gear.  Not currently infected now though this does give her pain.  PCP is Sl[place]ky, Excell Seltzer., MD.  No Known Allergies  Review of Systems: Negative except as noted in the HPI.  Objective:  There were no vitals filed for this visit.  Teresa Lam is a pleasant 38 y.o. female in NAD. AAO x 3.  Vascular Examination: Capillary refill time is less than 3 seconds to toes bilateral. Palpable pedal pulses b/l LE. Digital hair present b/l. No pedal edema b/l. Skin temperature gradient WNL b/l. No varicosities b/l. No cyanosis or clubbing noted b/l.   Dermatological Examination: There is incurvation of the bilateral hallux bilateral nail border.  There is pain on palpation of the affected nail border.  Associated hyperkeratotic tissue present.  Minimal edema.  No erythema.  No drainage.  Neurological Examination: Protective sensation intact with Semmes-Weinstein 10 gram monofilament b/l LE. Vibratory sensation intact b/l LE.      No data to display           Assessment/Plan: 1. Ingrown toenail of both feet     No orders of the defined types were placed in this encounter.   Discussed patient's condition today.  After obtaining patient  consent, the bilateral hallux was anesthetized with a 50:50 mixture of 1% lidocaine plain and 0.5% bupivacaine plain for a total of 3cc's administered to each toe.  Upon confirmation of anesthesia, a freer elevator was utilized to free the bilateral nail borders from the nail bed on both toes.  The nail borders were then avulsed proximal to the eponychium and removed in toto.  The areas were inspected for any remaining spicules.  A chemical matrixectomy was performed with NaOH and neutralized with acetic acid solution.  Antibiotic ointment and a DSD were applied, followed by a Coban dressing.  Patient tolerated the anesthetic and procedure well and will f/u in 2-3 weeks for recheck.  Patient given post-procedure instructions for daily 15-minute Epsom salt soaks, antibiotic ointment and daily use of Bandaids until toe starts to dry / form eschar.   Does complain of some heel pain as well which we can evaluate further at follow-up.  Return in about 2 weeks (around 06/25/2023) for Nail Check, heel pain.   Teresa Lam L. Marchia Bond, AACFAS [facility]     [address], [place] [address]                Office [phone])  [phone]  Fax [phone]

## 2023-06-11 NOTE — Patient Instructions (Signed)
 Place 1/4 cup of epsom salts in a quart of warm tap water.  Submerge your foot or feet in the solution and soak for 20 minutes.  This soak should be done twice a day.  Next, remove your foot or feet from solution, blot dry the affected area. Apply ointment and cover if instructed by your doctor.   IF YOUR SKIN BECOMES IRRITATED WHILE USING THESE INSTRUCTIONS, IT IS OKAY TO SWITCH TO  WHITE VINEGAR AND WATER.  As another alternative soak, you may use antibacterial soap and water.  Monitor for any signs/symptoms of infection. Call the office immediately if any occur or go directly to the emergency room. Call with any questions/concerns.  You may apply a small amount of Neosporin to the ingrown toenail sites for the first 5 to 7 days.  After that time continue to do the regular soaking and bandaging until a dry scab starts to form.  Allow the toe to air dry before applying Band-Aid.

## 2023-06-25 ENCOUNTER — Ambulatory Visit (INDEPENDENT_AMBULATORY_CARE_PROVIDER_SITE_OTHER): Admitting: Podiatry

## 2023-06-25 DIAGNOSIS — Z91199 Patient's noncompliance with other medical treatment and regimen due to unspecified reason: Secondary | ICD-10-CM

## 2023-06-27 NOTE — Progress Notes (Signed)
 Patient absent from appointment
